# Patient Record
Sex: Male | Born: 1965 | Hispanic: Yes | Marital: Single
Health system: Southern US, Community
[De-identification: ages and names within clinical notes are randomized; demographics above are authoritative.]

---

## 2006-02-17 ENCOUNTER — Emergency Department: Payer: Self-pay | Admitting: Internal Medicine

## 2006-02-17 ENCOUNTER — Other Ambulatory Visit: Payer: Self-pay

## 2014-06-01 ENCOUNTER — Inpatient Hospital Stay: Payer: Self-pay | Admitting: Internal Medicine

## 2014-06-01 LAB — CBC WITH DIFFERENTIAL/PLATELET
BASOS PCT: 0.1 %
Basophil #: 0 10*3/uL (ref 0.0–0.1)
Eosinophil #: 0 10*3/uL (ref 0.0–0.7)
Eosinophil %: 0 %
HCT: 31.6 % — AB (ref 40.0–52.0)
HGB: 10.6 g/dL — ABNORMAL LOW (ref 13.0–18.0)
Lymphocyte #: 2.3 10*3/uL (ref 1.0–3.6)
Lymphocyte %: 16.1 %
MCH: 30.3 pg (ref 26.0–34.0)
MCHC: 33.6 g/dL (ref 32.0–36.0)
MCV: 90 fL (ref 80–100)
MONO ABS: 0.6 x10 3/mm (ref 0.2–1.0)
Monocyte %: 3.9 %
NEUTROS ABS: 11.6 10*3/uL — AB (ref 1.4–6.5)
NEUTROS PCT: 79.9 %
PLATELETS: 223 10*3/uL (ref 150–440)
RBC: 3.49 10*6/uL — ABNORMAL LOW (ref 4.40–5.90)
RDW: 12 % (ref 11.5–14.5)
WBC: 14.6 10*3/uL — ABNORMAL HIGH (ref 3.8–10.6)

## 2014-06-01 LAB — COMPREHENSIVE METABOLIC PANEL WITH GFR
Albumin: 3.1 g/dL — ABNORMAL LOW
Alkaline Phosphatase: 145 U/L — ABNORMAL HIGH
Anion Gap: 9
BUN: 19 mg/dL — ABNORMAL HIGH
Bilirubin,Total: 0.9 mg/dL
Calcium, Total: 8.5 mg/dL
Chloride: 96 mmol/L — ABNORMAL LOW
Co2: 24 mmol/L
Creatinine: 1.09 mg/dL
EGFR (African American): >60
EGFR (Non-African Amer.): >60
Glucose: 466 mg/dL — ABNORMAL HIGH
Osmolality: 282
Potassium: 3.7 mmol/L
SGOT(AST): 19 U/L
SGPT (ALT): 18 U/L
Sodium: 129 mmol/L — ABNORMAL LOW
Total Protein: 8.2 g/dL

## 2014-06-02 LAB — CBC WITH DIFFERENTIAL/PLATELET
Basophil #: 0 10*3/uL (ref 0.0–0.1)
Basophil %: 0.3 %
EOS PCT: 0.2 %
Eosinophil #: 0 10*3/uL (ref 0.0–0.7)
HCT: 32 % — ABNORMAL LOW (ref 40.0–52.0)
HGB: 11.2 g/dL — AB (ref 13.0–18.0)
LYMPHS ABS: 2.2 10*3/uL (ref 1.0–3.6)
Lymphocyte %: 19.8 %
MCH: 31.3 pg (ref 26.0–34.0)
MCHC: 34.9 g/dL (ref 32.0–36.0)
MCV: 90 fL (ref 80–100)
MONO ABS: 0.6 x10 3/mm (ref 0.2–1.0)
Monocyte %: 5.4 %
Neutrophil #: 8.4 10*3/uL — ABNORMAL HIGH (ref 1.4–6.5)
Neutrophil %: 74.3 %
Platelet: 207 10*3/uL (ref 150–440)
RBC: 3.57 10*6/uL — ABNORMAL LOW (ref 4.40–5.90)
RDW: 12 % (ref 11.5–14.5)
WBC: 11.3 10*3/uL — AB (ref 3.8–10.6)

## 2014-06-02 LAB — LIPID PANEL
Cholesterol: 90 mg/dL (ref 0–200)
HDL Cholesterol: 34 mg/dL — ABNORMAL LOW (ref 40–60)
LDL CHOLESTEROL, CALC: 26 mg/dL (ref 0–100)
Triglycerides: 152 mg/dL (ref 0–200)
VLDL Cholesterol, Calc: 30 mg/dL (ref 5–40)

## 2014-06-02 LAB — BASIC METABOLIC PANEL
ANION GAP: 10 (ref 7–16)
BUN: 15 mg/dL (ref 7–18)
CALCIUM: 8.3 mg/dL — AB (ref 8.5–10.1)
CREATININE: 1.01 mg/dL (ref 0.60–1.30)
Chloride: 103 mmol/L (ref 98–107)
Co2: 22 mmol/L (ref 21–32)
EGFR (African American): >60
EGFR (Non-African Amer.): >60
GLUCOSE: 430 mg/dL — AB (ref 65–99)
OSMOLALITY: 289 (ref 275–301)
Potassium: 3.9 mmol/L (ref 3.5–5.1)
SODIUM: 135 mmol/L — AB (ref 136–145)

## 2014-06-02 LAB — HEMOGLOBIN A1C: Hemoglobin A1C: 11.3 % — ABNORMAL HIGH (ref 4.2–6.3)

## 2014-06-02 LAB — MAGNESIUM: MAGNESIUM: 1.7 mg/dL — AB

## 2014-06-03 LAB — CBC WITH DIFFERENTIAL/PLATELET
Basophil #: 0 10*3/uL (ref 0.0–0.1)
Basophil %: 0.3 %
Eosinophil #: 0.1 10*3/uL (ref 0.0–0.7)
Eosinophil %: 0.7 %
HCT: 32.2 % — ABNORMAL LOW (ref 40.0–52.0)
HGB: 11.2 g/dL — ABNORMAL LOW (ref 13.0–18.0)
LYMPHS PCT: 26.5 %
Lymphocyte #: 2.6 10*3/uL (ref 1.0–3.6)
MCH: 30.8 pg (ref 26.0–34.0)
MCHC: 34.7 g/dL (ref 32.0–36.0)
MCV: 89 fL (ref 80–100)
MONO ABS: 0.6 x10 3/mm (ref 0.2–1.0)
MONOS PCT: 5.8 %
NEUTROS ABS: 6.4 10*3/uL (ref 1.4–6.5)
NEUTROS PCT: 66.7 %
Platelet: 241 10*3/uL (ref 150–440)
RBC: 3.62 10*6/uL — ABNORMAL LOW (ref 4.40–5.90)
RDW: 12 % (ref 11.5–14.5)
WBC: 9.7 10*3/uL (ref 3.8–10.6)

## 2014-06-03 LAB — BASIC METABOLIC PANEL
Anion Gap: 11 (ref 7–16)
BUN: 8 mg/dL (ref 7–18)
CO2: 22 mmol/L (ref 21–32)
Calcium, Total: 8.5 mg/dL (ref 8.5–10.1)
Chloride: 105 mmol/L (ref 98–107)
Creatinine: 0.81 mg/dL (ref 0.60–1.30)
EGFR (African American): >60
EGFR (Non-African Amer.): >60
GLUCOSE: 257 mg/dL — AB (ref 65–99)
Osmolality: 283 (ref 275–301)
POTASSIUM: 3.4 mmol/L — AB (ref 3.5–5.1)
SODIUM: 138 mmol/L (ref 136–145)

## 2014-06-03 LAB — VANCOMYCIN, TROUGH: Vancomycin, Trough: 5 ug/mL — ABNORMAL LOW (ref 10–20)

## 2014-06-03 LAB — MAGNESIUM: MAGNESIUM: 1.6 mg/dL — AB

## 2014-06-06 LAB — CULTURE, BLOOD (SINGLE)

## 2014-06-07 LAB — WOUND CULTURE

## 2015-02-08 NOTE — Consult Note (Signed)
PATIENT NAME:  Timothy Mckenzie, Timothy Mckenzie MR#:  [id number] DATE OF BIRTH:  1965/11/19  DATE OF CONSULTATION:  06/02/2014  REFERRING PHYSICIAN:   CONSULTING PHYSICIAN:  Linus Galasodd Allaya Abbasi, DPM  REASON FOR CONSULTATION: This is a 49 year old male with a history of diabetes and HIV who recently presented to the Emergency Department with redness and swelling in his right foot. He states that about a week ago he had his foot in a foot massager and a blister developed on the bottom of his right foot. He states this opened up and then he subsequently developed some redness and pain a few days ago. He states that the area on the bottom of his foot has been dry and he has not had any additional drainage once the blister came off. He states that over the last day since he has been in the hospital it does seem to be getting better with the antibiotics. He denies any history of injury other than his foot being in the foot massager.   PAST MEDICAL HISTORY: Diabetes, HIV.    PAST SURGICAL HISTORY: No surgical history.  HOME MEDICATIONS: Glipizide 10 mg p.o. daily. Medication for HIV, does not recall the name.   FAMILY HISTORY: Positive for diabetes.   SOCIAL HISTORY: Denies alcohol, tobacco or illicit drugs.   ALLERGIES: No known drug allergies.   REVIEW OF SYSTEMS: CONSTITUTIONAL: He has had recent fever and chills but none currently. Denies headaches or dizziness.   EYES: Denies blurry or double vision.  EARS, NOSE, AND THROAT: Denies any tinnitus or congestion or slurred speech.  CARDIOVASCULAR: No chest pain, palpitations.   PULMONARY: Denies any cough or shortness of breath.  GASTROINTESTINAL: Denies any nausea, vomiting or abdominal pain.  GENITOURINARY: No dysuria or incontinence.  SKIN: Some swelling and redness in his right foot and ankle. He states it is improving since he has been in the hospital.  MUSCULOSKELETAL: Pain in his right foot and ankle.   PHYSICAL EXAMINATION:  VASCULAR: DP and PT  pulses are fully palpable. Capillary filling time is intact.  NEUROLOGICAL: Protective threshold with a monofilament wire is intact and symmetric to both feet and digits. Proprioception is intact.  INTEGUMENT: Skin is warm, dry and supple. There is maybe some mild hair loss. Significant erythema and edema in the right foot and ankle. A large eschar is present on the plantar aspect of the right foot just distal to the heel, approximately 3 cm x 1.5 cm, this is dry with no acute drainage or palpable abscess beneath the lesion.  MUSCULOSKELETAL: Adequate range of motion. Some guarding on the right secondary to pain. There is some pain on direct palpation up around the ankle. Again, no palpable fluid collections are noted and there is no expressible purulence.   DIAGNOSTIC DATA: X-rays:  Three views of the right foot reveal some soft tissue edema, but there is no evidence of any gas in the tissues. No cortical erosions or signs of osteomyelitis.   LABORATORY DATA: Admission lab work revealed an elevated white count at 14.6 with 11.6 neutrophils and a left shift. Follow up blood work today revealed a reduction down to 11.3 white blood cells with 8.4 neutrophils.   Hemoglobin A1c 11.3.   IMPRESSION:  1.  Diabetes, poorly controlled.  2.  Ulceration, right plantar foot with significant cellulitis of the foot and leg.   PLAN: At this point, the lesion is dry so there is no real need for bandage. I discussed with the patient through an interpreter  additional options, including imaging with MRI to evaluate for an abscess. We will plan on this for tomorrow. Depending on the findings, he may just need some antibiotics and observation. No clear indication for emergent debridement of the foot. We will follow the results of his MRI tomorrow.    ____________________________ Linus Galas, DPM tc:TT D: 06/02/2014 17:14:38 ET T: 06/02/2014 20:19:20 ET JOB#: [id number]  cc: Linus Galas, DPM, <Dictator> Kathlyne Loud  DPM ELECTRONICALLY SIGNED 06/14/2014 13:06

## 2015-02-08 NOTE — Consult Note (Signed)
PATIENT NAME:  Timothy Mckenzie, OVERLEY MR#:  [id number] DATE OF BIRTH:  1966/03/17  DATE OF CONSULTATION:  06/03/2014  REQUESTING PHYSICIAN:  Bettey Costa, MD     CONSULTING PHYSICIAN:  Cheral Marker. Ola Spurr, MD  REASON FOR CONSULTATION: Diabetic foot infection and HIV.   HISTORY OF PRESENT ILLNESS: This is a 49 year old gentleman with well-controlled HIV as well as poorly controlled diabetes with peripheral neuropathy. He was admitted August 15 after the development of a blister on the bottom of his foot.  He had been massaging his foot with a machine due to use some numbness in the leg when he noticed a large blister develop. This then popped, and the leg became very red, swollen and tender. He started noticing redness spreading up his legs and was having subjective fevers and chills.   The patient was admitted, started on IV vancomycin and Zosyn. The redness has improved and he has been afebrile. His white blood count has come down. He is being followed by podiatry.   PAST MEDICAL HISTORY:  1.  HIV.  This is well controlled with most recent CD4 of 616 and viral load less than 40 on Atripla, but testing done in 02/2014 at [facility] with Nehemiah Settle in the [facility]ious Disease clinic.  2.  Diabetes, poorly controlled, currently follows with a physician locally in [place] but he cannot tell us the name. He is on orals.  3.  Peripheral neuropathy.  4.  Hypertension.   PAST SURGICAL HISTORY:  None.   SOCIAL HISTORY: Lives with his nephew. Does not smoke, drink or use drugs.   FAMILY HISTORY: Noncontributory.   ALLERGIES: No known drug allergies.   ANTIBIOTICS SINCE ADMISSION: Include vancomycin and Zosyn.  Outpatient antiretrovirals include Atripla.    REVIEW OF SYSTEMS:  An 11 systems review, negative except as per HPI.   PHYSICAL EXAMINATION:  VITAL SIGNS: Temperature 98.1, pulse 95, blood pressure 134/82, respirations 17, saturations 98% on room air.  GENERAL: He is pleasant, interactive,  in no acute distress.  HEENT: Pupils equal, round, and reactive to light and accommodation. Extraocular movements are intact. Sclerae are anicteric.  OROPHARYNX: Clear with no thrush.  NECK: Supple.  HEART: Regular.  LUNGS: Clear.  ABDOMEN: Soft, nontender, nondistended. No hepatosplenomegaly. EXTREMITIES:  On his right lower extremity, he has mild erythema over his medial ankle.  He has a large elliptical shaped ulcer at the base of his foot.  This has a black eschar on top.  There is some mild surrounding erythema, but no drainage. He has 2+ dorsalis pedis pulses there. He does have decreased sensation.  SKIN: On his face, he has a few flaky, scaly lesions.  IMAGING:  X-ray of the right foot showed soft tissue swelling and no acute osseous abnormalities. MRI of the foot showed no acute evidence of osteomyelitis, but there is a soft tissue ulceration and a small subcutaneous fluid collection, which could represent a small abscess measuring approximately 1.9 x 1.5 x 1.3 cm.   There is some plantar hindfoot cellulitis with extension into the medial ankle.    LABORATORY DATA:  White blood count on admission was 14.6; currently 9.7, hemoglobin 11.2, platelets 241. Blood cultures x 2 are negative. CRP is quite elevated at 239.  LFTs are normal, except alkaline phosphatase of 145. Renal function is normal with a creatinine of 0.81. A1c is markedly elevated at 11.3.   IMPRESSION: A 49 year old gentleman with well-controlled HIV and poorly controlled diabetes admitted with a blister that developed  into an infected ulcer and cellulitis of his right foot and ankle. There is no evidence of osteomyelitis. His ESR is quite high. Currently, the redness had spread up to his leg initially, but has subsided with vancomycin and Zosyn.  No cultures are available as his blood cultures are negative.   RECOMMENDATIONS:  1.  Followup with podiatry.  We were asked about whether or not the fluid collection should be  drained.  I think if there is a substantial fluid collection there, it would be best to try to debride it to allow healing, given the very poor diabetic control that he currently has.  This could potentially prevent spread into the bone.  2.  Given the rapid response over the last 2 to 3 days since admission, per report he would likely benefit from oral antibiotics as opposed to IV.   It depends on what is found with further podiatry followup.  3.  For his HIV, this is well controlled and I confirmed his dosing at [facility].   Thank you for the consult. I will be glad to follow with you.     ____________________________ Cheral Marker. Ola Spurr, MD dpf:DT D: 06/03/2014 16:00:12 ET T: 06/03/2014 17:05:40 ET JOB#: [id number]  cc: Cheral Marker. Ola Spurr, MD, <Dictator> DAVID Ola Spurr MD ELECTRONICALLY SIGNED 06/07/2014 22:29

## 2015-02-08 NOTE — Discharge Summary (Signed)
PATIENT NAME:  Timothy Mckenzie, Timothy Mckenzie MR#:  [id number] DATE OF BIRTH:  1966-05-11  DATE OF ADMISSION:  06/01/2014 DATE OF DISCHARGE:  06/04/2014  DISCHARGE DIAGNOSES:  1. Right foot plantar abscess status post incision and drainage.  2. Diabetes mellitus type 2, uncontrolled.  3. HIV.   DISCHARGE MEDICATIONS:  1. Percocet 5/325 one tablet 4 times a day as needed.  2. Metformin 1000 mg 2 times a day.  3. Glipizide 10 mg oral 2 times a day.  4. Atripla 1 tablet oral once a day.  5. Novolin 70/30 ten units subcutaneous 2 times a day before meals.  6. Augmentin 875 one tablet 2 times a day for 1 week.   CONSULTS: Dr. Alberteen Spindleline and Dr. Sampson GoonFitzgerald.   IMAGING STUDIES: Include:  1. A foot x-ray which showed cellulitis.  2. MRI which showed a plantar abscess, no osteomyelitis.   ADMITTING HISTORY AND PHYSICAL: Please see detailed H and P dictated by Dr. Imogene Burnhen. In brief, a 49 year old Spanish-speaking male patient presented to the hospital complaining of right foot ulcer swelling, redness, pain. The patient was started on broad-spectrum antibiotics, admitted to the hospitalist service.   HOSPITAL COURSE: The patient was on vancomycin and Zosyn initially. Has been afebrile. White count trended down to normal. The patient had an MRI done which showed no osteomyelitis, but showed a small abscess, had an I and D done by podiatry. This seems to be growing streptococcus at this time and will be discharged home with Augmentin. Home health has been set up for daily dressing changes per Dr. Dory Larsenline's recommendations. For his uncontrolled diabetes, his HbA1c is elevated at 11.3. His glipizide has been increased to twice a day. Metformin added and he will be on 70/30 insulin b.i.d. He will follow up with his primary care physician and will likely need up titration of his insulin depending on his blood sugar control. He did have inpatient diabetes teaching.   Prior to discharge, the patient's wound did not have any  pus drainage. Lungs sounded clear. S1, S2 heard on cardiovascular exam with no edema in the extremities.   DISCHARGE INSTRUCTIONS: Diabetic diet. Activity as tolerated. Follow up with podiatry and Dr. Sampson GoonFitzgerald in a week and check blood sugars 4 times a day a.c., h.s.   TIME SPENT ON DAY OF DISCHARGE IN DISCHARGE ACTIVITY: Was 40 minutes.    ____________________________ Molinda BailiffSrikar R. Morty Ortwein, MD srs:lt D: 06/05/2014 13:38:11 ET T: 06/05/2014 15:57:38 ET JOB#: [id number]  cc: Wardell HeathSrikar R. Sencere Symonette, MD, <Dictator> Stann Mainlandavid P. Sampson GoonFitzgerald, MD Wardell HeathSRIKAR West Bali Ronnell Makarewicz MD ELECTRONICALLY SIGNED 07/10/2014 16:30

## 2015-02-08 NOTE — H&P (Signed)
PATIENT NAME:  Timothy Mckenzie, Timothy Mckenzie MR#:  [id number] DATE OF BIRTH:  08/07/1966  DATE OF ADMISSION:  06/01/2014  PRIMARY CARE PHYSICIAN: nonlocal.  REFERRING PHYSICIAN : Dr. Lenard Lance    CHIEF COMPLAINT: Right foot ulcer and swelling, redness and pain for the past two days.   HISTORY OF PRESENT ILLNESS: A 49 year old male with a history of diabetes, HIV, who presented to the ED with the above chief complaint. The patient is alert, awake, oriented, in no acute distress. The patient only speaks [place]. History was opened by interpreter. The patient has right foot and numbness two days ago, got a massage, and then he developed a blister on the bulb of the right foot. The patient did noted that his foot began swelling, red and tender since yesterday. In addition, he notices the redness and pain extending up to leg with growing pain. The patient also complains of fever, chills, but the patient denies any other symptoms.   PAST MEDICAL HISTORY: Diabetes, HIV.   SOCIAL HISTORY: No smoking, alcohol, drinking or illicit drugs.   PAST SURGICAL HISTORY: None.   FAMILY HISTORY: Diabetes in his family.    ALLERGIES: None.   HOME MEDICATIONS: Glipizide 10 mg p.o. daily. The patient also is taking HIV medication and he does not know the name.   REVIEW OF SYSTEMS:  CONSTITUTIONAL: The patient has a fever, chills. No headache or dizziness. No weakness.  EYES: No double or blurred vision.   ENT: No postnasal drip, slurred speech or dysphagia.  CARDIOVASCULAR: No chest pain, palpitation, orthopnea, nocturnal dyspnea. No leg edema.  PULMONARY: No cough, sputum, shortness of breath or hemoptysis.  GASTROINTESTINAL: No abdominal pain, nausea, vomiting, diarrhea. No melena or bloody stool.  GENITOURINARY: No dysuria, hematuria, or incontinence.  SKIN: No rash or jaundice.  MUSCULOSKELETAL: Right foot pain erythema and swelling.   PHYSICAL EXAMINATION:  VITAL SIGNS: Temperature 98.4, blood pressure  131/73, pulse 68, oxygen saturation 100% on room air.  GENERAL: The patient is alert, awake, oriented, in no acute distress.  HEENT: Pupils round, equal, and reactive to light and accommodation.  NECK: Supple. No JVD or carotid bruit. No lymphadenopathy.  No thyromegaly. Moist oral mucosa. Clear pharynx.  CARDIOVASCULAR: S1, S2 regular rate and rhythm. No murmurs or gallops.  PULMONARY: Bilateral air entry. No wheezing or rales. No use of accessory muscle to breathe.  ABDOMEN: Soft. No distention or tenderness. No organomegaly. Bowel sounds present.  EXTREMITIES: No edema, clubbing or cyanosis. No calf tenderness, but there is a ulcer about 4 cm wide on the bottom of the right foot. Swelling erythema and tenderness on the right foot. In addition, there is erythema and tenderness up to thigh.  There is lymph nodes with tenderness in the right upper groin area, but negative on the left groin area. Right foot x-ray shows soft tissue with swelling.  NEUROLOGY: Alert and oriented x 3. No focal deficit. Power 5/5. Sensation intact.   LABORATORY DATA: Right foot x-ray showed soft tissue swelling. No soft tissue, gas or evidence of osteomyelitis. WBC 14.6, hemoglobin 10.6, platelets 223, glucose 466, BUN 19, creatinine 1.09, sodium 129, potassium 3.7, chloride 96, bicarbonate 24.   IMPRESSIONS:  1. Right foot ulcer with infection, need to rule out abscess.  2. Right foot with right lower extremity cellulitis.  3. Leukocytosis.  4. Hyponatremia.  5. Anemia.  6. Diabetes.  7. HIV.   PLAN OF TREATMENT:  1. The patient will be admitted to the medical floor. The patient was  treated with vancomycin one dose in the ED. We will continue vancomycin and Zosyn, follow up blood culture, CBC, and CRP and we will get a podiatry consult and ID consult.  2. Give IV fluid support and follow up BMP.  3. For diabetes, we will start a sliding scale. Check hemoglobin A1C. Hold glipizide.  4. I discussed the patient's  condition and plan of treatment with the patient. The patient wants full code.   TIME SPENT: About 63 minutes.    ____________________________ Timothy PollackQing Mykah Shin, MD qc:JT D: 06/01/2014 21:46:12 ET T: 06/01/2014 23:00:15 ET JOB#: [id number]  cc: Timothy PollackQing Emalyn Schou, MD, <Dictator> Timothy PollackQING Tamica Covell MD ELECTRONICALLY SIGNED 06/02/2014 14:40

## 2016-04-01 IMAGING — MR MRI OF THE RIGHT ANKLE WITHOUT AND WITH CONTRAST
9 series · 38 of 40 positions shown · IV contrast (multihance)
Comparison: Foot radiographs 06/01/2014

CLINICAL DATA: Nonhealing ankle ulcer. History of diabetes.
Evaluate for abscess or osteomyelitis.

EXAM:
MRI OF THE RIGHT ANKLE WITHOUT AND WITH CONTRAST
TECHNIQUE: Multiplanar, multisequence MR imaging of the ankle was performed
before and after the administration of intravenous contrast.
CONTRAST:  13 ml MultiHance.

[Series 7: T1 · axial · 3.0mm · 0.56mm/px · z∈[-55,+104]mm · 6 of 45 slices shown (1 of 2)]
[im 1/45]
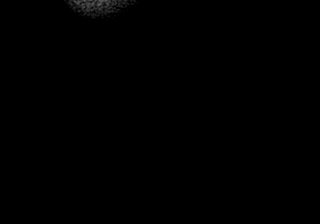
[im 9/45]
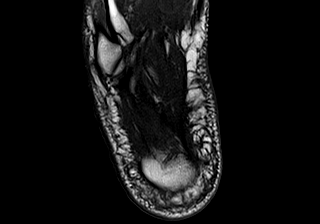
[im 18/45]
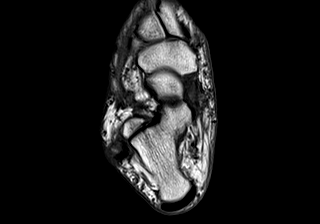
[im 27/45]
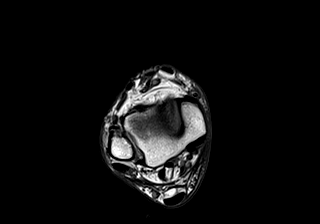
[im 36/45]
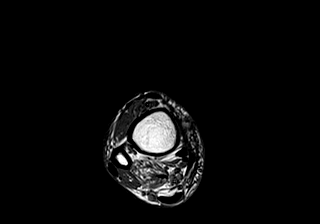
[im 45/45]
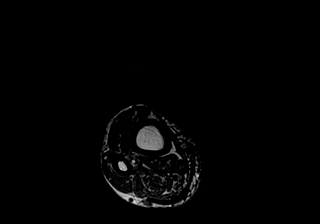

[Series 8: axial t1fs (only · axial · 3.0mm · 0.70mm/px · z∈[-53,+105]mm · 6 of 45 slices shown]
[im 1/45]
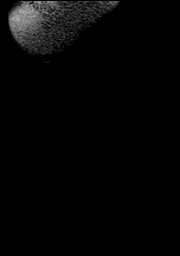
[im 9/45]
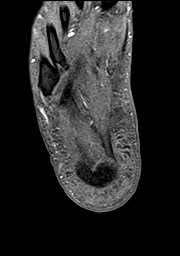
[im 18/45]
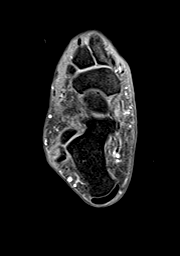
[im 27/45]
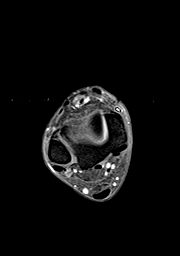
[im 36/45]
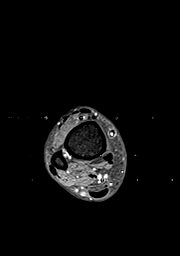
[im 45/45]
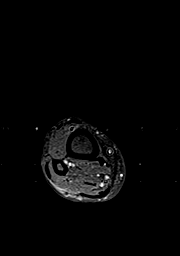

[Series 10: T1 · coronal · 3.0mm · 0.56mm/px · 4 of 39 slices shown (2 of 2)]
[im 1/39]
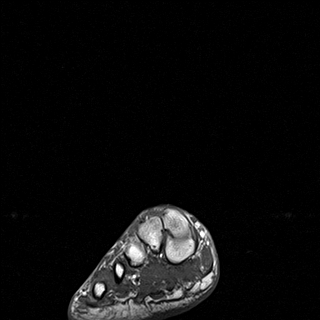
[im 13/39]
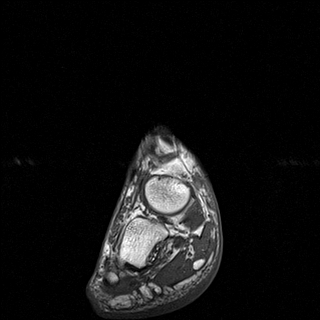
[im 26/39]
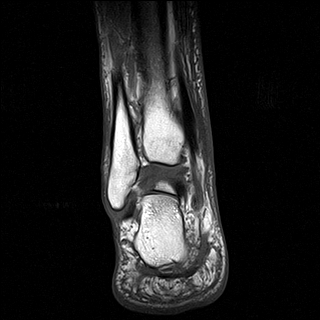
[im 39/39]
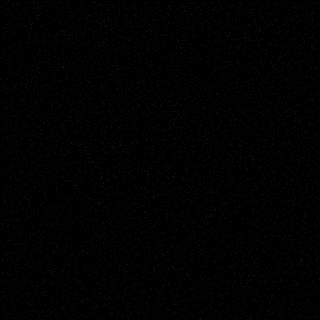

[Series 12: STIR · sagittal · 3.0mm · 0.35mm/px · 3 of 26 slices shown]
[im 1/26]
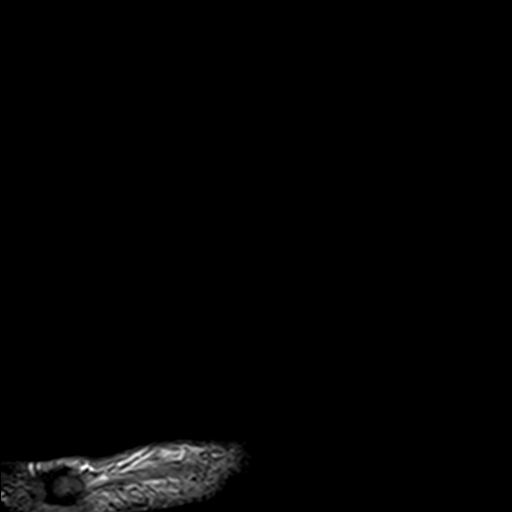
[im 13/26]
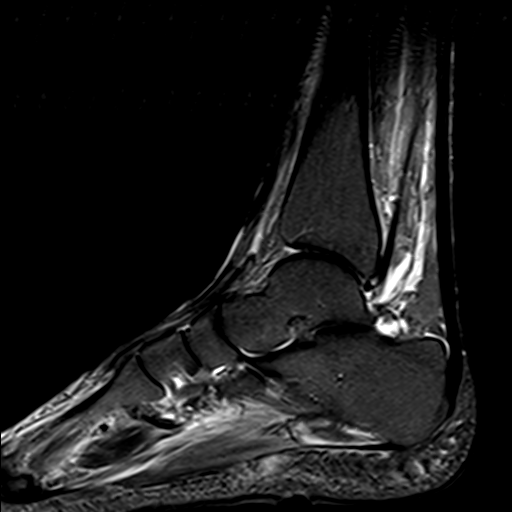
[im 26/26]
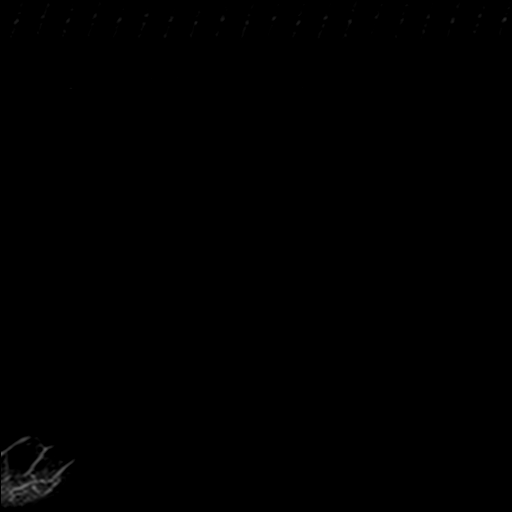

[Series 13: T1 fat-sat · axial · 3.0mm · 0.70mm/px · z∈[-53,+105]mm · 5 of 45 slices shown (1 of 3)]
[im 1/45]
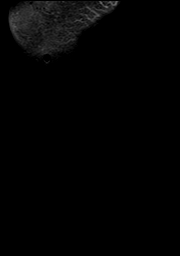
[im 12/45]
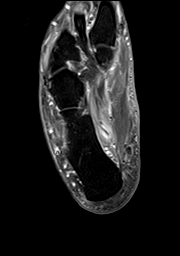
[im 23/45]
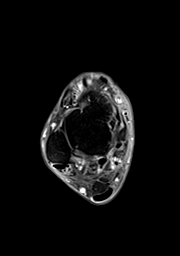
[im 34/45]
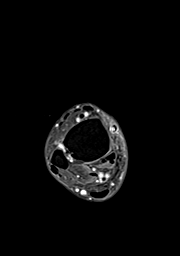
[im 45/45]
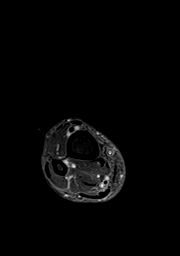

[Series 14: T1 fat-sat · coronal · 3.0mm · 0.70mm/px · 4 of 39 slices shown (2 of 3)]
[im 1/39]
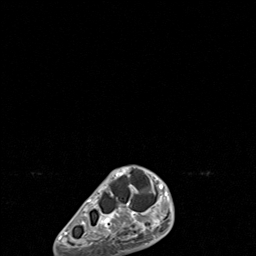
[im 13/39]
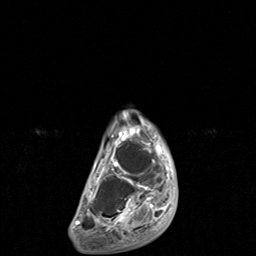
[im 26/39]
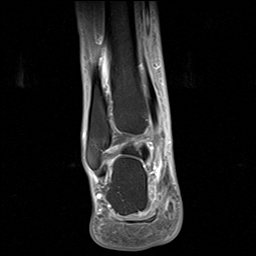
[im 39/39]
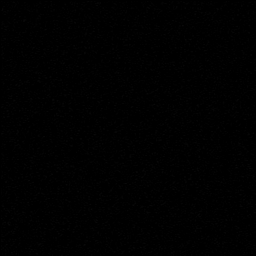

[Series 15: T1 fat-sat · sagittal · 3.0mm · 0.70mm/px · 3 of 26 slices shown (3 of 3)]
[im 1/26]
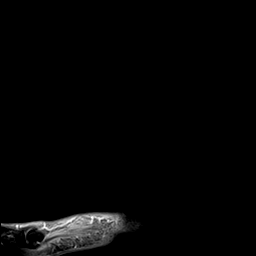
[im 13/26]
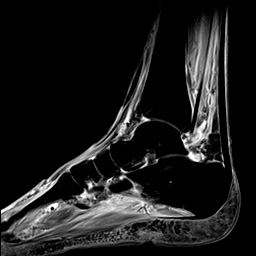
[im 26/26]
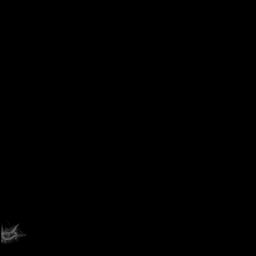

[Series 100: T2 · axial · 3.0mm · 0.35mm/px · z∈[-59,+99]mm · 5 of 45 slices shown]
[im 1/45]
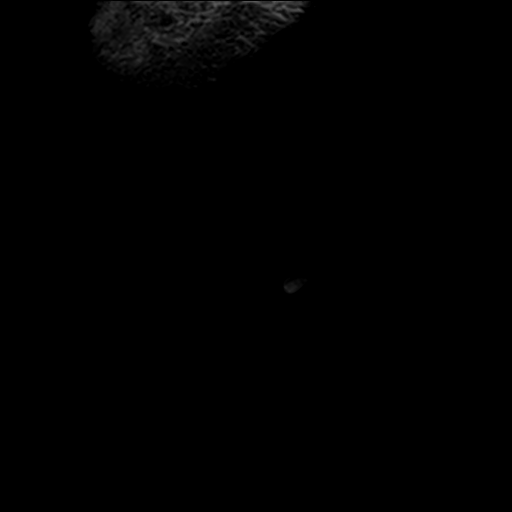
[im 12/45]
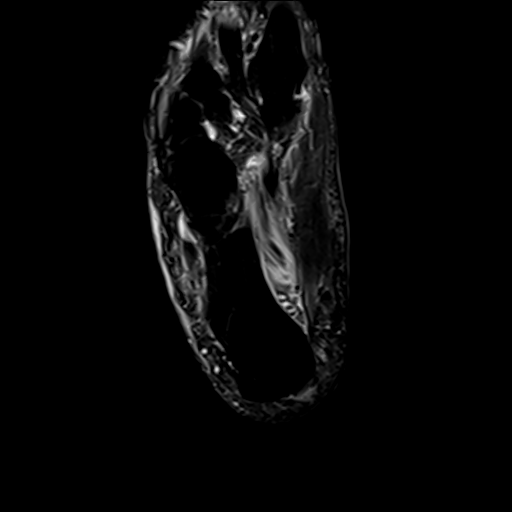
[im 23/45]
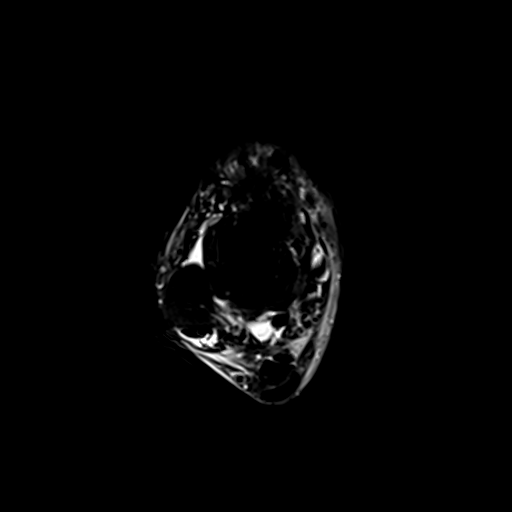
[im 34/45]
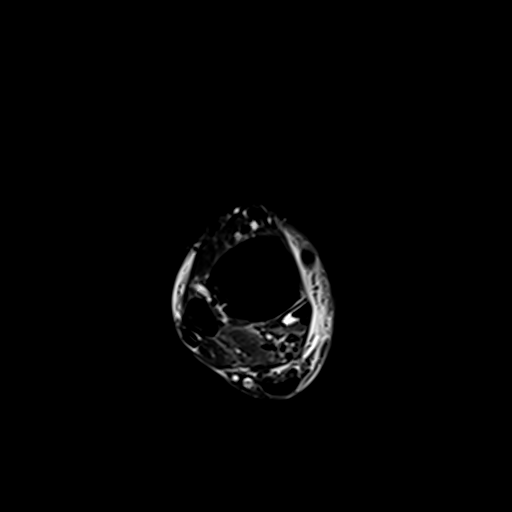
[im 45/45]
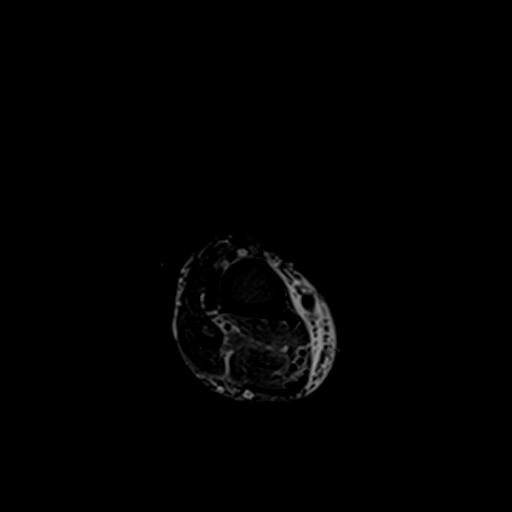

[Series 101: coronal stir_fil_1 · coronal · 3.0mm · 0.35mm/px · 2 of 39 slices shown]
[im 1/39]
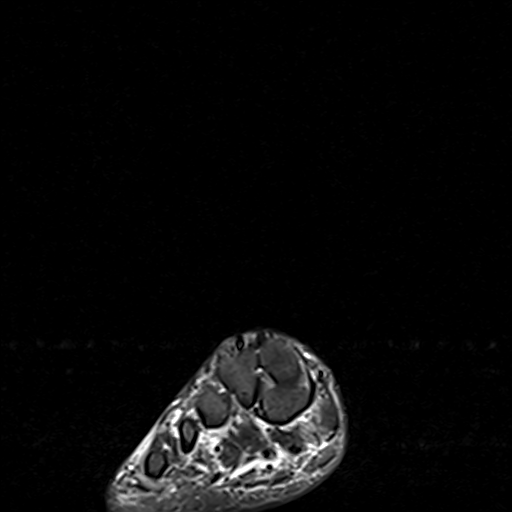
[im 13/39]
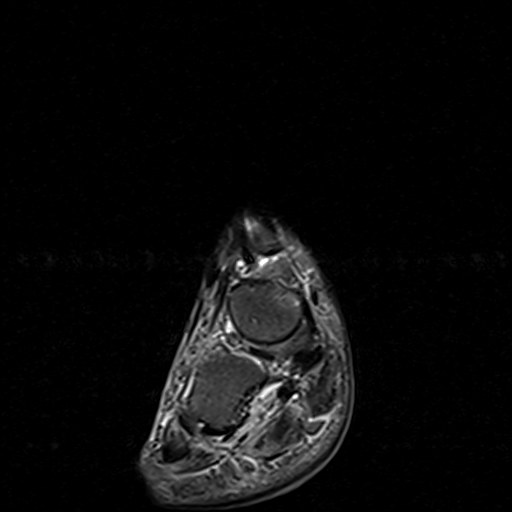

[38 of 40 positions shown; findings below may reference images not displayed]

FINDINGS: There is skin ulceration in plantar aspect of the hindfoot medially,
best seen on the post-contrast images. There is an ill-defined
subcutaneous fluid collection which measures approximately 1.9 x
x 1.3 cm. This collection lies superficial to the medial medial cord
which is intact. No other focal fluid collections are identified.
There is subcutaneous enhancement throughout the plantar hindfoot.
There is also subcutaneous edema and enhancement medially in the
ankle. One of the underlying superficial veins demonstrates
increased T1 signal which could be due to slow flow, although
superficial thrombophlebitis cannot be excluded.

TENDONS

Peroneal: Intact and normally positioned. There is a small amount of
fluid within the peroneal tendon sheath. Post-contrast, there is
mild enhancement of the peroneal tendon sheath.

Posteromedial: Intact and normally positioned.

Anterior: Intact and normally positioned.

Achilles: Intact.

Plantar Fascia: Intact.

LIGAMENTS

Lateral: Intact.

Medial: Intact.

CARTILAGE

Ankle Joint: No significant ankle joint effusion. The talar dome and
tibial plafond are intact.

Subtalar Joints/Sinus Tarsi: Unremarkable.

Bones: There is no evidence of osteomyelitis. Mild degenerative
changes are present at the first part metatarsal phalangeal joint.
Intraosseous cystic changes in the first metatarsal head may be
postoperative.
IMPRESSION: IMPRESSION
1. Soft tissue ulceration in the plantar hindfoot as described.
There is a small subcutaneous fluid collection which could reflect a
small abscess.
2. Adjacent plantar hindfoot cellulitis with probable extension into
the medial ankle. Signal alteration within one of the superficial
veins medially at the ankle could be due to slow flow, although
superficial thrombophlebitis could have this appearance.
3. No evidence of intra-articular infection or osteomyelitis.
